# Patient Record
Sex: Male | Born: 1965 | Race: Black or African American | Hispanic: No | Marital: Single | Smoking: Current every day smoker
Health system: Southern US, Community
[De-identification: ages and names within clinical notes are randomized; demographics above are authoritative.]

## PROBLEM LIST (undated history)

## (undated) DIAGNOSIS — I10 Essential (primary) hypertension: Secondary | ICD-10-CM

## (undated) DIAGNOSIS — W3400XA Accidental discharge from unspecified firearms or gun, initial encounter: Secondary | ICD-10-CM

## (undated) DIAGNOSIS — D649 Anemia, unspecified: Secondary | ICD-10-CM

## (undated) HISTORY — PX: SHOULDER SURGERY: SHX246

---

## 2019-04-27 ENCOUNTER — Emergency Department (HOSPITAL_COMMUNITY)
Admission: EM | Admit: 2019-04-27 | Discharge: 2019-04-27 | Disposition: A | Discharge status: Home / Self Care | Payer: Self-pay | Attending: Emergency Medicine | Admitting: Emergency Medicine

## 2019-04-27 ENCOUNTER — Other Ambulatory Visit: Payer: Self-pay

## 2019-04-27 DIAGNOSIS — Y9289 Other specified places as the place of occurrence of the external cause: Secondary | ICD-10-CM | POA: Insufficient documentation

## 2019-04-27 DIAGNOSIS — S39012A Strain of muscle, fascia and tendon of lower back, initial encounter: Secondary | ICD-10-CM | POA: Insufficient documentation

## 2019-04-27 DIAGNOSIS — Y9389 Activity, other specified: Secondary | ICD-10-CM | POA: Insufficient documentation

## 2019-04-27 DIAGNOSIS — X500XXA Overexertion from strenuous movement or load, initial encounter: Secondary | ICD-10-CM | POA: Insufficient documentation

## 2019-04-27 DIAGNOSIS — Y999 Unspecified external cause status: Secondary | ICD-10-CM | POA: Insufficient documentation

## 2019-04-27 MED ORDER — CYCLOBENZAPRINE HCL 10 MG PO TABS: 10.0000 mg | ORAL_TABLET | Freq: Two times a day (BID) | ORAL | 0 refills | Status: AC | PRN

## 2019-04-27 MED ORDER — IBUPROFEN 600 MG PO TABS: 600.0000 mg | ORAL_TABLET | Freq: Four times a day (QID) | ORAL | 0 refills | Status: AC | PRN

## 2019-04-27 MED ORDER — IBUPROFEN 800 MG PO TABS
800.0000 mg | ORAL_TABLET | Freq: Once | ORAL | Status: AC
  Administered 2019-04-27: 800 mg via ORAL
  Filled 2019-04-27: qty 1

## 2019-04-27 MED ORDER — CYCLOBENZAPRINE HCL 10 MG PO TABS
5.0000 mg | ORAL_TABLET | Freq: Once | ORAL | Status: AC
  Administered 2019-04-27: 5 mg via ORAL
  Filled 2019-04-27: qty 1

## 2019-04-27 NOTE — ED Triage Notes (Signed)
Per pt he said he was at home at got up from chair and felt like a burning sensation in his right lower back. No injury no fall

## 2019-04-27 NOTE — ED Provider Notes (Signed)
[facility] EMERGENCY DEPARTMENT Provider Note   CSN: [id number] Arrival date & time: 04/27/19  2023     History   Chief Complaint Chief Complaint  Patient presents with  . Back Pain    HPI James Ransom. is a 53 y.o. male.     The history is provided by the patient. No language interpreter was used.  Back Pain Associated symptoms: no fever and no numbness      53 year old male presenting for evaluation of back pain.  Patient report approximately an hour ago he was sitting at work, he stood up when he felt pain to his right lower back.  He described pain as a burning sensation, nonradiating, worsening with movement. Rest pain is minimal.  He does not complain of any fever or chills no dysuria hematuria abdominal pain bowel bladder incontinence or saddle anesthesia.  He denies any recent heavy lifting or strenuous activities.  No history of kidney stones.  No history of AAA.  No lightheadedness or dizziness.  No chest pain or shortness of breath.  He denies any specific treatment tried.  No past medical history on file.  There are no active problems to display for this patient.   The histories are not reviewed yet. Please review them in the "History" navigator section and refresh this [place].      Home Medications    Prior to Admission medications   Not on File    Family History No family history on file.  Social History Social History   Tobacco Use  . Smoking status: Not on file  Substance Use Topics  . Alcohol use: Not on file  . Drug use: Not on file     Allergies   Penicillins   Review of Systems Review of Systems  Constitutional: Negative for fever.  Musculoskeletal: Positive for back pain.  Neurological: Negative for numbness.     Physical Exam Updated Vital Signs BP (!) 167/102 (BP Location: Left Arm)   Pulse 88   Temp 98.6 F (37 C) (Oral)   Resp 18   Ht 5\' 10"  (1.778 m)   Wt 113.4 kg   SpO2 95%   BMI 35.87  kg/m   Physical Exam Vitals signs and nursing note reviewed.  Constitutional:      General: He is not in acute distress.    Appearance: He is well-developed.  HENT:     Head: Atraumatic.  Eyes:     Conjunctiva/sclera: Conjunctivae normal.  Neck:     Musculoskeletal: Neck supple.  Abdominal:     Palpations: Abdomen is soft.     Tenderness: There is no abdominal tenderness.  Musculoskeletal:        General: Tenderness (Tenderness to right lumbar paraspinal muscle on palpation.  No significant midline spine tenderness crepitus or step-off.  No overlying skin changes.  Negative straight leg raise.) present.  Skin:    Findings: No rash.  Neurological:     Mental Status: He is alert.      ED Treatments / Results  Labs (all labs ordered are listed, but only abnormal results are displayed) Labs Reviewed - No data to display  EKG    Radiology No results found.  Procedures Procedures (including critical care time)  Medications Ordered in ED Medications  ibuprofen (ADVIL) tablet 800 mg (has no administration in time range)  cyclobenzaprine (FLEXERIL) tablet 5 mg (has no administration in time range)     Initial Impression / Assessment and Plan / ED  Course  I have reviewed the triage vital signs and the nursing notes.  Pertinent labs & imaging results that were available during my care of the patient were reviewed by me and considered in my medical decision making (see chart for details).        BP (!) 167/102 (BP Location: Left Arm)   Pulse 88   Temp 98.6 F (37 C) (Oral)   Resp 18   Ht 5\' 10"  (1.778 m)   Wt 113.4 kg   SpO2 95%   BMI 35.87 kg/m    Final Clinical Impressions(s) / ED Diagnoses   Final diagnoses:  Strain of lumbar region, initial encounter    ED Discharge Orders         Ordered    ibuprofen (ADVIL) 600 MG tablet  Every 6 hours PRN     04/27/19 2124    cyclobenzaprine (FLEXERIL) 10 MG tablet  2 times daily PRN     04/27/19 2124          9:23 PM Patient here with right lower back pain reproducible on exam.  This is likely muscle skeletal strain.  Low suspicion for kidney stones, aortic dissection, herpes zoster, or other acute emergent medical condition.  No red flags.  He is able to ambulate.  Patient discharged home with rice therapy and return precaution.   Fayrene Helperran, Liliana Brentlinger, PA-C 04/27/19 2125    Little, Ambrose Finlandachel Morgan, MD 04/27/19 2129

## 2020-08-23 ENCOUNTER — Encounter (HOSPITAL_COMMUNITY): Payer: Self-pay | Admitting: Emergency Medicine

## 2020-08-23 ENCOUNTER — Other Ambulatory Visit: Payer: Self-pay

## 2020-08-23 ENCOUNTER — Ambulatory Visit (HOSPITAL_COMMUNITY)
Admission: EM | Admit: 2020-08-23 | Discharge: 2020-08-23 | Disposition: A | Discharge status: Home / Self Care | Payer: Self-pay | Attending: Family Medicine | Admitting: Family Medicine

## 2020-08-23 DIAGNOSIS — L739 Follicular disorder, unspecified: Secondary | ICD-10-CM

## 2020-08-23 MED ORDER — DOXYCYCLINE HYCLATE 100 MG PO CAPS: 100.0000 mg | ORAL_CAPSULE | Freq: Two times a day (BID) | ORAL | 0 refills | Status: DC

## 2020-08-23 NOTE — ED Triage Notes (Signed)
PT had two itching areas on scalp that are now individual abscesses.

## 2020-08-23 NOTE — Discharge Instructions (Addendum)
I have sent in doxycycline for you to take twice a day for 7 days  This should decrease the swelling and inflammation surrounding the areas on her head.  Take a hot shower, and then you may apply warm compresses to the areas so that if they will drain they can do it on their own since there is already an opening to the area.  If this does not resolve her symptoms, follow-up with this office or with primary care as needed  Follow-up with the ER for high fever, trouble swallowing, trouble breathing, other concerning symptoms

## 2020-08-23 NOTE — ED Provider Notes (Signed)
[facility]   [id number] 08/23/20 Arrival Time: 1337  CC: RASH  SUBJECTIVE:  James Harding. is a 54 y.o. male who presents with a skin complaint that began about a week ago.  Reports that he thought that he had two bug bites on his scalp, as they were itchy over the last week.  Reports that the areas are now swollen and he thinks they have fluid under them.  Has not attempted OTC treatment for this. Denies precipitating event or trauma.  Denies changes in soaps, detergents, close contacts with similar rash, known trigger or environmental trigger, allergy. Denies medications change or starting a new medication recently. Localizes the rash to the scalp. There are no aggravating or alleviating factors. Denies similar symptoms in the past. Denies fever, chills, nausea, vomiting, erythema, discharge, oral lesions, SOB, chest pain, abdominal pain, changes in bowel or bladder function.    ROS: As per HPI.  All other pertinent ROS negative.     History reviewed. No pertinent past medical history. History reviewed. No pertinent surgical history. Allergies  Allergen Reactions  . Penicillins    No current facility-administered medications on file prior to encounter.   Current Outpatient Medications on File Prior to Encounter  Medication Sig Dispense Refill  . cyclobenzaprine (FLEXERIL) 10 MG tablet Take 1 tablet (10 mg total) by mouth 2 (two) times daily as needed for muscle spasms. 20 tablet 0  . ibuprofen (ADVIL) 600 MG tablet Take 1 tablet (600 mg total) by mouth every 6 (six) hours as needed. 30 tablet 0   Social History   Socioeconomic History  . Marital status: Significant Other    Spouse name: Not on file  . Number of children: Not on file  . Years of education: Not on file  . Highest education level: Not on file  Occupational History  . Not on file  Tobacco Use  . Smoking status: Current Every Day Smoker    Packs/day: 0.50    Types: Cigarettes  . Smokeless  tobacco: Never Used  Substance and Sexual Activity  . Alcohol use: Not on file  . Drug use: Not on file  . Sexual activity: Not on file  Other Topics Concern  . Not on file  Social History Narrative  . Not on file   Social Determinants of Health   Financial Resource Strain:   . Difficulty of Paying Living Expenses: Not on file  Food Insecurity:   . Worried About Programme researcher, broadcasting/film/video in the Last Year: Not on file  . Ran Out of Food in the Last Year: Not on file  Transportation Needs:   . Lack of Transportation (Medical): Not on file  . Lack of Transportation (Non-Medical): Not on file  Physical Activity:   . Days of Exercise per Week: Not on file  . Minutes of Exercise per Session: Not on file  Stress:   . Feeling of Stress : Not on file  Social Connections:   . Frequency of Communication with Friends and Family: Not on file  . Frequency of Social Gatherings with Friends and Family: Not on file  . Attends Religious Services: Not on file  . Active Member of Clubs or Organizations: Not on file  . Attends Banker Meetings: Not on file  . Marital Status: Not on file  Intimate Partner Violence:   . Fear of Current or Ex-Partner: Not on file  . Emotionally Abused: Not on file  . Physically Abused: Not on file  .  Sexually Abused: Not on file   No family history on file.  OBJECTIVE: Vitals:   08/23/20 1434  BP: (!) 159/113  Pulse: 88  Resp: 16  Temp: 99.1 F (37.3 C)  TempSrc: Oral  SpO2: 96%    General appearance: alert; no distress Head: NCAT Lungs: clear to auscultation bilaterally Heart: regular rate and rhythm.  Radial pulse 2+ bilaterally Extremities: no edema Skin: warm and dry; there are two areas present on the scalp about a centimeter each in diameter, that are mildly swollen with a centralized lesion.  The lesion to the back of the scalp appears to have been opened previously. Psychological: alert and cooperative; normal mood and  affect  ASSESSMENT & PLAN:  1. Folliculitis     Meds ordered this encounter  Medications  . doxycycline (VIBRAMYCIN) 100 MG capsule    Sig: Take 1 capsule (100 mg total) by mouth 2 (two) times daily.    Dispense:  14 capsule    Refill:  0    Order Specific Question:   Supervising Provider    Answer:   Merrilee Jansky [[id number]]     We will treat for folliculitis Prescribe doxycycline Take as prescribed and to completion Avoid hot showers/ baths Moisturize skin daily  Follow up with PCP if symptoms persists Return or go to the ER if you have any new or worsening symptoms such as fever, chills, nausea, vomiting, redness, swelling, discharge, if symptoms do not improve with medications  Reviewed expectations re: course of current medical issues. Questions answered. Outlined signs and symptoms indicating need for more acute intervention. Patient verbalized understanding. After Visit Summary given.   Moshe Cipro, NP 08/23/20 1454

## 2021-09-16 ENCOUNTER — Encounter (HOSPITAL_BASED_OUTPATIENT_CLINIC_OR_DEPARTMENT_OTHER): Payer: Self-pay

## 2021-09-16 ENCOUNTER — Other Ambulatory Visit: Payer: Self-pay

## 2021-09-16 ENCOUNTER — Emergency Department (HOSPITAL_BASED_OUTPATIENT_CLINIC_OR_DEPARTMENT_OTHER)
Admission: EM | Admit: 2021-09-16 | Discharge: 2021-09-16 | Disposition: A | Discharge status: Home / Self Care | Payer: Self-pay | Attending: Emergency Medicine | Admitting: Emergency Medicine

## 2021-09-16 ENCOUNTER — Emergency Department (HOSPITAL_BASED_OUTPATIENT_CLINIC_OR_DEPARTMENT_OTHER): Payer: Self-pay

## 2021-09-16 DIAGNOSIS — Y9354 Activity, bowling: Secondary | ICD-10-CM | POA: Insufficient documentation

## 2021-09-16 DIAGNOSIS — W01198A Fall on same level from slipping, tripping and stumbling with subsequent striking against other object, initial encounter: Secondary | ICD-10-CM | POA: Insufficient documentation

## 2021-09-16 DIAGNOSIS — F17210 Nicotine dependence, cigarettes, uncomplicated: Secondary | ICD-10-CM | POA: Insufficient documentation

## 2021-09-16 DIAGNOSIS — M25562 Pain in left knee: Secondary | ICD-10-CM | POA: Insufficient documentation

## 2021-09-16 HISTORY — DX: Accidental discharge from unspecified firearms or gun, initial encounter: W34.00XA

## 2021-09-16 MED ORDER — KETOROLAC TROMETHAMINE 60 MG/2ML IM SOLN
60.0000 mg | Freq: Once | INTRAMUSCULAR | Status: AC
  Administered 2021-09-16: 60 mg via INTRAMUSCULAR
  Filled 2021-09-16: qty 2

## 2021-09-16 NOTE — Discharge Instructions (Signed)
You may take tylenol (acetaminophen) 1000 mg 4 times a day for 1 week. This is the maximum dose of Tylenol you can take from all sources. Please check other over-the-counter medications and prescriptions to ensure you are not taking other medications that contain acetaminophen.  You may also take ibuprofen 400 mg 6 times a day alternating with or at the same time as tylenol.

## 2021-09-16 NOTE — ED Notes (Addendum)
Pt discharged to home. Discharge instructions have been discussed with patient and/or family members. Pt verbally acknowledges understanding d/c instructions, and endorses comprehension to checkout at registration before leaving. Pt demonstrated use of crutches prior to d/c

## 2021-09-16 NOTE — ED Triage Notes (Signed)
Pt arrives to ED in wheelchair states that he was bowling last night and passed the line on the floor hitting the slick lane causing him to fall states his left leg went under him. Reports he was able to get up when it happened, did not hit head, no LOC. Reports pain in knee with standing today.

## 2021-09-16 NOTE — ED Notes (Signed)
ED Provider at bedside. 
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
## 2021-10-30 ENCOUNTER — Emergency Department (HOSPITAL_BASED_OUTPATIENT_CLINIC_OR_DEPARTMENT_OTHER): Payer: Self-pay

## 2021-10-30 ENCOUNTER — Emergency Department (HOSPITAL_BASED_OUTPATIENT_CLINIC_OR_DEPARTMENT_OTHER)
Admission: EM | Admit: 2021-10-30 | Discharge: 2021-10-30 | Disposition: A | Discharge status: Home / Self Care | Payer: Self-pay | Attending: Emergency Medicine | Admitting: Emergency Medicine

## 2021-10-30 ENCOUNTER — Other Ambulatory Visit: Payer: Self-pay

## 2021-10-30 ENCOUNTER — Encounter (HOSPITAL_BASED_OUTPATIENT_CLINIC_OR_DEPARTMENT_OTHER): Payer: Self-pay

## 2021-10-30 DIAGNOSIS — F17290 Nicotine dependence, other tobacco product, uncomplicated: Secondary | ICD-10-CM | POA: Insufficient documentation

## 2021-10-30 DIAGNOSIS — L089 Local infection of the skin and subcutaneous tissue, unspecified: Secondary | ICD-10-CM | POA: Insufficient documentation

## 2021-10-30 DIAGNOSIS — M79671 Pain in right foot: Secondary | ICD-10-CM | POA: Insufficient documentation

## 2021-10-30 LAB — CBG MONITORING, ED: Glucose-Capillary: 100 mg/dL — ABNORMAL HIGH (ref 70–99)

## 2021-10-30 MED ORDER — DOXYCYCLINE HYCLATE 100 MG PO CAPS: 100.0000 mg | ORAL_CAPSULE | Freq: Two times a day (BID) | ORAL | 0 refills | Status: DC

## 2021-10-30 NOTE — ED Triage Notes (Signed)
Pt c/o blisters/ swelling to right foot started 12/11-NAD-steady gait

## 2021-10-30 NOTE — Discharge Instructions (Addendum)
Please use Tylenol or ibuprofen for pain.  You may use 600 mg ibuprofen every 6 hours or 1000 mg of Tylenol every 6 hours.  You may choose to alternate between the 2.  This would be most effective.  Not to exceed 4 g of Tylenol within 24 hours.  Not to exceed 3200 mg ibuprofen 24 hours.  Please follow up with a podiatrist at your earliest convenience for further evaluation.
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
## 2021-11-27 ENCOUNTER — Other Ambulatory Visit (HOSPITAL_BASED_OUTPATIENT_CLINIC_OR_DEPARTMENT_OTHER): Payer: Self-pay

## 2021-11-27 ENCOUNTER — Other Ambulatory Visit: Payer: Self-pay

## 2021-11-27 ENCOUNTER — Encounter (HOSPITAL_BASED_OUTPATIENT_CLINIC_OR_DEPARTMENT_OTHER): Payer: Self-pay

## 2021-11-27 ENCOUNTER — Emergency Department (HOSPITAL_BASED_OUTPATIENT_CLINIC_OR_DEPARTMENT_OTHER)
Admission: EM | Admit: 2021-11-27 | Discharge: 2021-11-27 | Disposition: A | Discharge status: Home / Self Care | Payer: BC Managed Care – PPO | Attending: Emergency Medicine | Admitting: Emergency Medicine

## 2021-11-27 DIAGNOSIS — I10 Essential (primary) hypertension: Secondary | ICD-10-CM | POA: Diagnosis present

## 2021-11-27 DIAGNOSIS — Z79899 Other long term (current) drug therapy: Secondary | ICD-10-CM | POA: Diagnosis not present

## 2021-11-27 MED ORDER — AMLODIPINE BESYLATE 5 MG PO TABS
5.0000 mg | ORAL_TABLET | Freq: Every day | ORAL | 2 refills | Status: AC
  Filled 2021-11-27: qty 30, 30d supply, fill #0
  Filled 2022-01-06: qty 30, 30d supply, fill #1

## 2021-11-27 NOTE — ED Triage Notes (Addendum)
Pt c/o elevated BP today at the dentist "180/120"-advised to come to ED-denies hx dx HTN/no meds/no PCP-denies pain-states "little headache yesterday"-NAD-steady gait

## 2021-11-27 NOTE — ED Provider Notes (Signed)
[facility] EMERGENCY DEPARTMENT Provider Note   CSN: [id number] Arrival date & time: 11/27/21  1259     History  Chief Complaint  Patient presents with   Hypertension    James Penkala Weslee Fogg. is a 56 y.o. male.  Sent here from dentist office for evaluation of high blood pressure.  Asymptomatic.  No chest pain or shortness of breath.  No stroke symptoms.  States that he has had high blood pressure readings in the past but is not on blood pressure medications.  Does not have a primary care doctor.  The history is provided by the patient.  Hypertension This is a new problem. The current episode started more than 1 week ago. The problem occurs every several days. Pertinent negatives include no chest pain, no abdominal pain, no headaches and no shortness of breath. Nothing aggravates the symptoms. Nothing relieves the symptoms. He has tried nothing for the symptoms. The treatment provided no relief.      Home Medications Prior to Admission medications   Medication Sig Start Date End Date Taking? Authorizing Provider  amLODipine (NORVASC) 5 MG tablet Take 1 tablet (5 mg total) by mouth daily. 11/27/21 12/27/21 Yes Arvie Bartholomew, DO  cyclobenzaprine (FLEXERIL) 10 MG tablet Take 1 tablet (10 mg total) by mouth 2 (two) times daily as needed for muscle spasms. 04/27/19   Fayrene Helper, PA-C  doxycycline (VIBRAMYCIN) 100 MG capsule Take 1 capsule (100 mg total) by mouth 2 (two) times daily. 10/30/21   Prosperi, Christian H, PA-C  ibuprofen (ADVIL) 600 MG tablet Take 1 tablet (600 mg total) by mouth every 6 (six) hours as needed. 04/27/19   Fayrene Helper, PA-C      Allergies    Penicillins    Review of Systems   Review of Systems  Respiratory:  Negative for shortness of breath.   Cardiovascular:  Negative for chest pain.  Gastrointestinal:  Negative for abdominal pain.  Neurological:  Negative for headaches.   Physical Exam Updated Vital Signs BP (!) 162/104 (BP Location: Left  Arm)    Pulse 99    Temp 98.3 F (36.8 C) (Oral)    Resp 18    Ht 5\' 10"  (1.778 m)    Wt 128.8 kg    SpO2 97%    BMI 40.75 kg/m  Physical Exam Vitals and nursing note reviewed.  Constitutional:      General: He is not in acute distress.    Appearance: He is well-developed.  HENT:     Head: Normocephalic and atraumatic.  Eyes:     Extraocular Movements: Extraocular movements intact.     Conjunctiva/sclera: Conjunctivae normal.     Pupils: Pupils are equal, round, and reactive to light.  Cardiovascular:     Rate and Rhythm: Normal rate and regular rhythm.     Pulses: Normal pulses.     Heart sounds: No murmur heard. Pulmonary:     Effort: Pulmonary effort is normal. No respiratory distress.     Breath sounds: Normal breath sounds.  Abdominal:     Palpations: Abdomen is soft.     Tenderness: There is no abdominal tenderness.  Musculoskeletal:        General: No swelling.     Cervical back: Neck supple.  Skin:    General: Skin is warm and dry.     Capillary Refill: Capillary refill takes less than 2 seconds.  Neurological:     General: No focal deficit present.     Mental Status:  He is alert and oriented to person, place, and time.     Cranial Nerves: No cranial nerve deficit.     Sensory: No sensory deficit.     Motor: No weakness.     Coordination: Coordination normal.     Gait: Gait normal.  Psychiatric:        Mood and Affect: Mood normal.    ED Results / Procedures / Treatments   Labs (all labs ordered are listed, but only abnormal results are displayed) Labs Reviewed - No data to display  EKG None  Radiology No results found.  Procedures Procedures    Medications Ordered in ED Medications - No data to display  ED Course/ Medical Decision Making/ A&P                           Medical Decision Making  James Harding Jonathandavid Marlett. is a 56 year old male who presents to the emergency department with high blood pressure.  Blood pressure 162/104 upon arrival.  He  is asymptomatic.  No chest pain, no stroke symptoms.  He has normal neurological exam and is very well-appearing.  He was sent here from the dentist office after he had a blood pressure there of 180 systolic.  States that he has had high blood pressure readings in the past but has not establish a primary care doctor.  Upon chart review it does appear that he has had blood pressures that have been abnormal in the past and overall we will start him on amlodipine.  He understands the medication side effects and return precautions and was discharged in the ED in good condition.  Given information to follow-up with primary care.  This chart was dictated using voice recognition software.  Despite best efforts to proofread,  errors can occur which can change the documentation meaning.         Final Clinical Impression(s) / ED Diagnoses Final diagnoses:  Hypertension, unspecified type    Rx / DC Orders ED Discharge Orders          Ordered    amLODipine (NORVASC) 5 MG tablet  Daily        11/27/21 1354              Virgina Norfolk, DO 11/27/21 1357

## 2022-01-06 ENCOUNTER — Other Ambulatory Visit (HOSPITAL_BASED_OUTPATIENT_CLINIC_OR_DEPARTMENT_OTHER): Payer: Self-pay

## 2022-02-17 ENCOUNTER — Emergency Department (HOSPITAL_BASED_OUTPATIENT_CLINIC_OR_DEPARTMENT_OTHER)
Admission: EM | Admit: 2022-02-17 | Discharge: 2022-02-17 | Disposition: A | Discharge status: Home / Self Care | Payer: BC Managed Care – PPO | Attending: Emergency Medicine | Admitting: Emergency Medicine

## 2022-02-17 ENCOUNTER — Encounter (HOSPITAL_BASED_OUTPATIENT_CLINIC_OR_DEPARTMENT_OTHER): Payer: Self-pay | Admitting: Emergency Medicine

## 2022-02-17 ENCOUNTER — Other Ambulatory Visit: Payer: Self-pay

## 2022-02-17 DIAGNOSIS — I10 Essential (primary) hypertension: Secondary | ICD-10-CM | POA: Diagnosis not present

## 2022-02-17 DIAGNOSIS — S90821A Blister (nonthermal), right foot, initial encounter: Secondary | ICD-10-CM | POA: Insufficient documentation

## 2022-02-17 DIAGNOSIS — Z79899 Other long term (current) drug therapy: Secondary | ICD-10-CM | POA: Diagnosis not present

## 2022-02-17 DIAGNOSIS — X58XXXA Exposure to other specified factors, initial encounter: Secondary | ICD-10-CM | POA: Insufficient documentation

## 2022-02-17 DIAGNOSIS — S99921A Unspecified injury of right foot, initial encounter: Secondary | ICD-10-CM | POA: Diagnosis present

## 2022-02-17 HISTORY — DX: Essential (primary) hypertension: I10

## 2022-02-17 HISTORY — DX: Anemia, unspecified: D64.9

## 2022-02-17 LAB — CBG MONITORING, ED: Glucose-Capillary: 114 mg/dL — ABNORMAL HIGH (ref 70–99)

## 2022-02-17 MED ORDER — DOXYCYCLINE HYCLATE 100 MG PO CAPS: 100.0000 mg | ORAL_CAPSULE | Freq: Two times a day (BID) | ORAL | 0 refills | Status: DC

## 2022-02-17 NOTE — ED Provider Notes (Signed)
?[facility] EMERGENCY DEPT ?Provider Note ? ? ?CSN: [id number] ?Arrival date & time: 02/17/22  1224 ? ?  ? ?History ? ?Chief Complaint  ?Patient presents with  ? Foot Swelling  ? ? ?Aren Pryde Verna Hamon. is a 56 y.o. male with a past medical history of hypertension presenting today due to a blister and pain to the medial right foot.  Says that the discomfort started last night and this morning he noted a blister.  No trauma.  Says he has not started to wear any new socks or shoes.  Says that this is happened twice in the past and he has been prescribed antibiotics that helped him.  No history of diabetes and is not immunocompromise.  No numbness or tingling and reports full sensation distally. ? ?HPI ? ?  ? ?Home Medications ?Prior to Admission medications   ?Medication Sig Start Date End Date Taking? Authorizing Provider  ?amLODipine (NORVASC) 5 MG tablet Take 1 tablet (5 mg total) by mouth daily. 11/27/21 02/05/22  Virgina Norfolk, DO  ?cyclobenzaprine (FLEXERIL) 10 MG tablet Take 1 tablet (10 mg total) by mouth 2 (two) times daily as needed for muscle spasms. 04/27/19   Fayrene Helper, PA-C  ?doxycycline (VIBRAMYCIN) 100 MG capsule Take 1 capsule (100 mg total) by mouth 2 (two) times daily. 10/30/21   Prosperi, Christian H, PA-C  ?ibuprofen (ADVIL) 600 MG tablet Take 1 tablet (600 mg total) by mouth every 6 (six) hours as needed. 04/27/19   Fayrene Helper, PA-C  ?   ? ?Allergies    ?Penicillins   ? ?Review of Systems   ?Review of Systems ? ?Physical Exam ?Updated Vital Signs ?BP (!) 134/108 (BP Location: Left Arm)   Pulse (!) 107   Temp 98.1 ?F (36.7 ?C) (Oral)   Resp 18   Ht 5\' 10"  (1.778 m)   Wt 128.4 kg   SpO2 97%   BMI 40.61 kg/m?  ?Physical Exam ?Vitals and nursing note reviewed.  ?Constitutional:   ?   Appearance: Normal appearance.  ?HENT:  ?   Head: Normocephalic and atraumatic.  ?Eyes:  ?   General: No scleral icterus. ?   Conjunctiva/sclera: Conjunctivae normal.  ?Cardiovascular:  ?   Pulses:  Normal pulses.  ?Pulmonary:  ?   Effort: Pulmonary effort is normal. No respiratory distress.  ?Musculoskeletal:  ?   Comments: Intact range of motion without pain of the ankle.  Intact strength to dorsiflexion, plantar flexion.   ?Skin: ?   Capillary Refill: Capillary refill takes less than 2 seconds.  ?   Findings: No rash.  ?   Comments: 2 cm blister to the medial right foot, along the arch.  Surrounding erythema.  The blister is intact, not oozing or draining.  All toes with onychomycosis  ?Neurological:  ?   Mental Status: He is alert.  ?Psychiatric:     ?   Mood and Affect: Mood normal.  ? ? ?ED Results / Procedures / Treatments   ?Labs ?(all labs ordered are listed, but only abnormal results are displayed) ?Labs Reviewed  ?CBG MONITORING, ED - Abnormal; Notable for the following components:  ?    Result Value  ? Glucose-Capillary 114 (*)   ? All other components within normal limits  ? ? ?EKG ?None ? ?Radiology ?No results found. ? ?Procedures ?Procedures  ? ?Medications Ordered in ED ?Medications - No data to display ? ?ED Course/ Medical Decision Making/ A&P ?  ?                        ?  Medical Decision Making ? ?56 year old male presenting with right foot pain.  Reports this is happened previously and he is unsure why this recurs but it often resolves with antibiotics.  Overall, his feet are very dry, flaky in all toes with fungal infection.  He has no bony tenderness in the blister is intact. ? ?CBG 114.  Neurovascularly intact.  DP pulse dopplered. ? ?Imaging was considered however patient's symptoms appear to be superficial, doubt osteomyelitis.  I will prescribe antibiotics to cover him for skin infection and encouraged him to follow-up with podiatry.  He says he has never seen them.  Per chart review, he has been referred to podiatry in the past.  We discussed the importance of following up with podiatry as well as his PCP. ? ?Has penicillin allergy, unsure of the reaction.  Will send doxycycline to  the pharmacy. ? ? ? ? ? ? ? ?Final Clinical Impression(s) / ED Diagnoses ?Final diagnoses:  ?Blister (nonthermal), right foot, initial encounter  ? ? ?Rx / DC Orders ?ED Discharge Orders   ? ?      Ordered  ?  doxycycline (VIBRAMYCIN) 100 MG capsule  2 times daily       ? 02/17/22 1329  ? ?  ?  ? ?  ? ?Results and diagnoses were explained to the patient. Return precautions discussed in full. Patient had no additional questions and expressed complete understanding. ? ? ?This chart was dictated using voice recognition software.  Despite best efforts to proofread,  errors can occur which can change the documentation meaning.  ?  ?Saddie Benders, PA-C ?02/17/22 1331 ? ?  ?Jacalyn Lefevre, MD ?02/17/22 1356 ? ?

## 2022-02-17 NOTE — Discharge Instructions (Addendum)
Please make an appointment with the podiatrist attached to these papers.  You may also be evaluated by her primary care provider if your symptoms do not resolve prior to getting in with podiatry. ? ?Be sure to wear shoes with arch support.  It also may help to use gauze or other padding over the blister while you are walking or standing for long periods of time. ?

## 2022-02-17 NOTE — ED Notes (Signed)
RN provided AVS using Teachback Method. Patient verbalizes understanding of Discharge Instructions. Opportunity for Questioning and Answers were provided by RN. Patient Discharged from ED ambulatory to Home. ° °

## 2022-02-17 NOTE — ED Triage Notes (Signed)
Patient c/o right foot swelling, pain and blister starting last night. Reports history of same and unsure the result. Denies any injury. Denies diabetes as hx.  ?

## 2022-07-24 IMAGING — DX DG FOOT COMPLETE 3+V*R*
3 series · 3 of 3 positions shown · non-contrast
Comparison: None

CLINICAL DATA: Foot pain, blisters and swelling to the RIGHT foot.

EXAM:
RIGHT FOOT COMPLETE - 3+ VIEW

[foot ap]
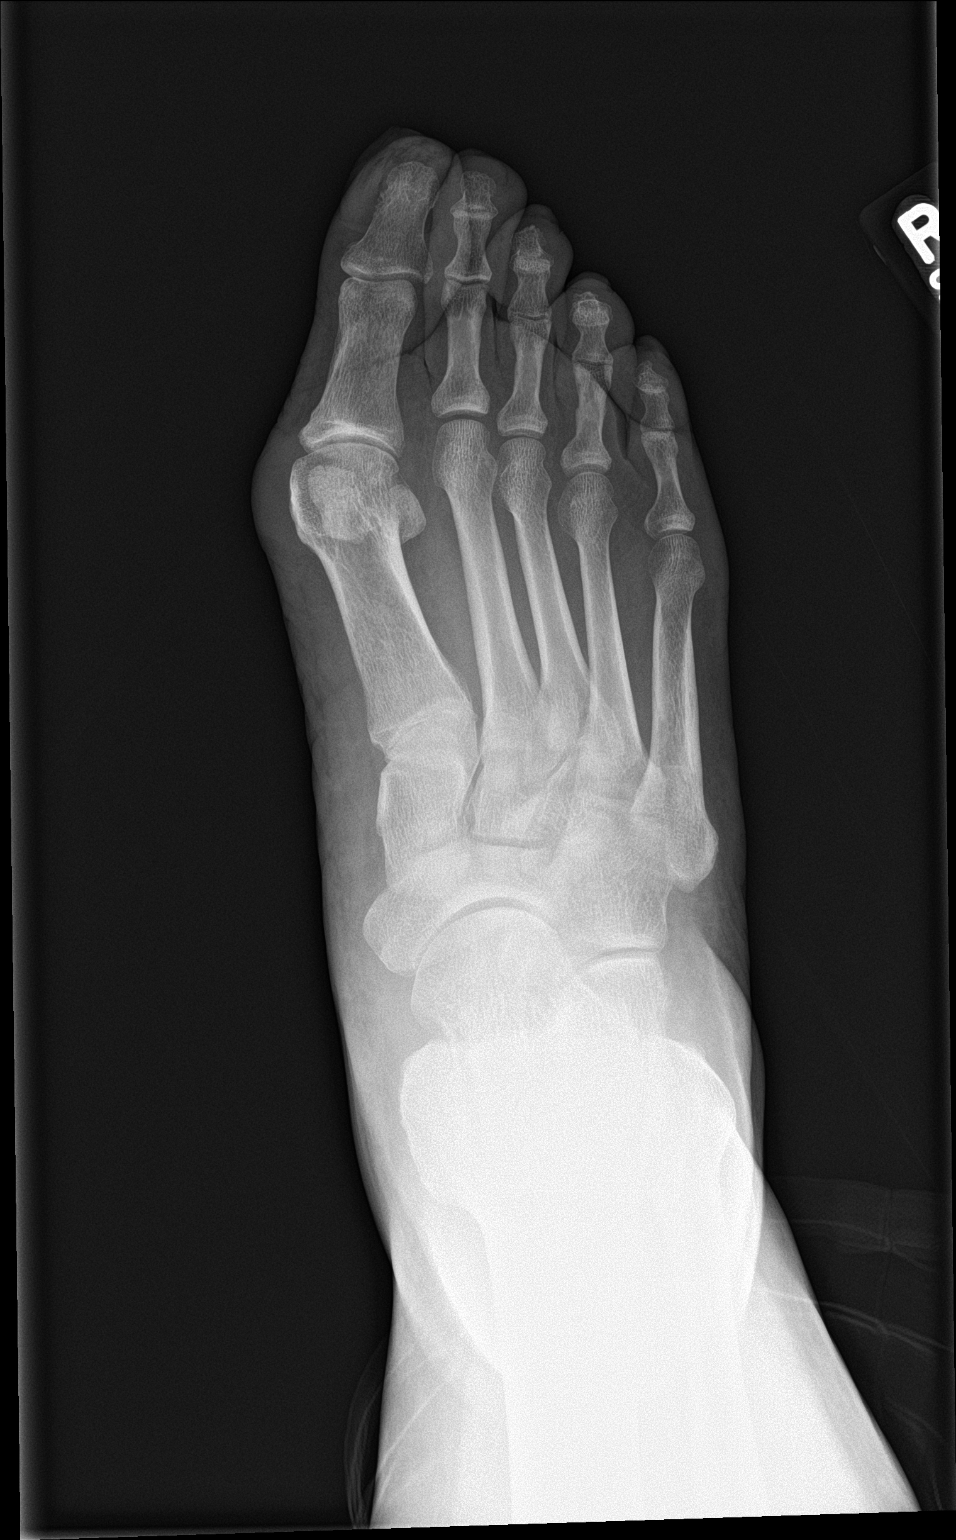

[foot obl]
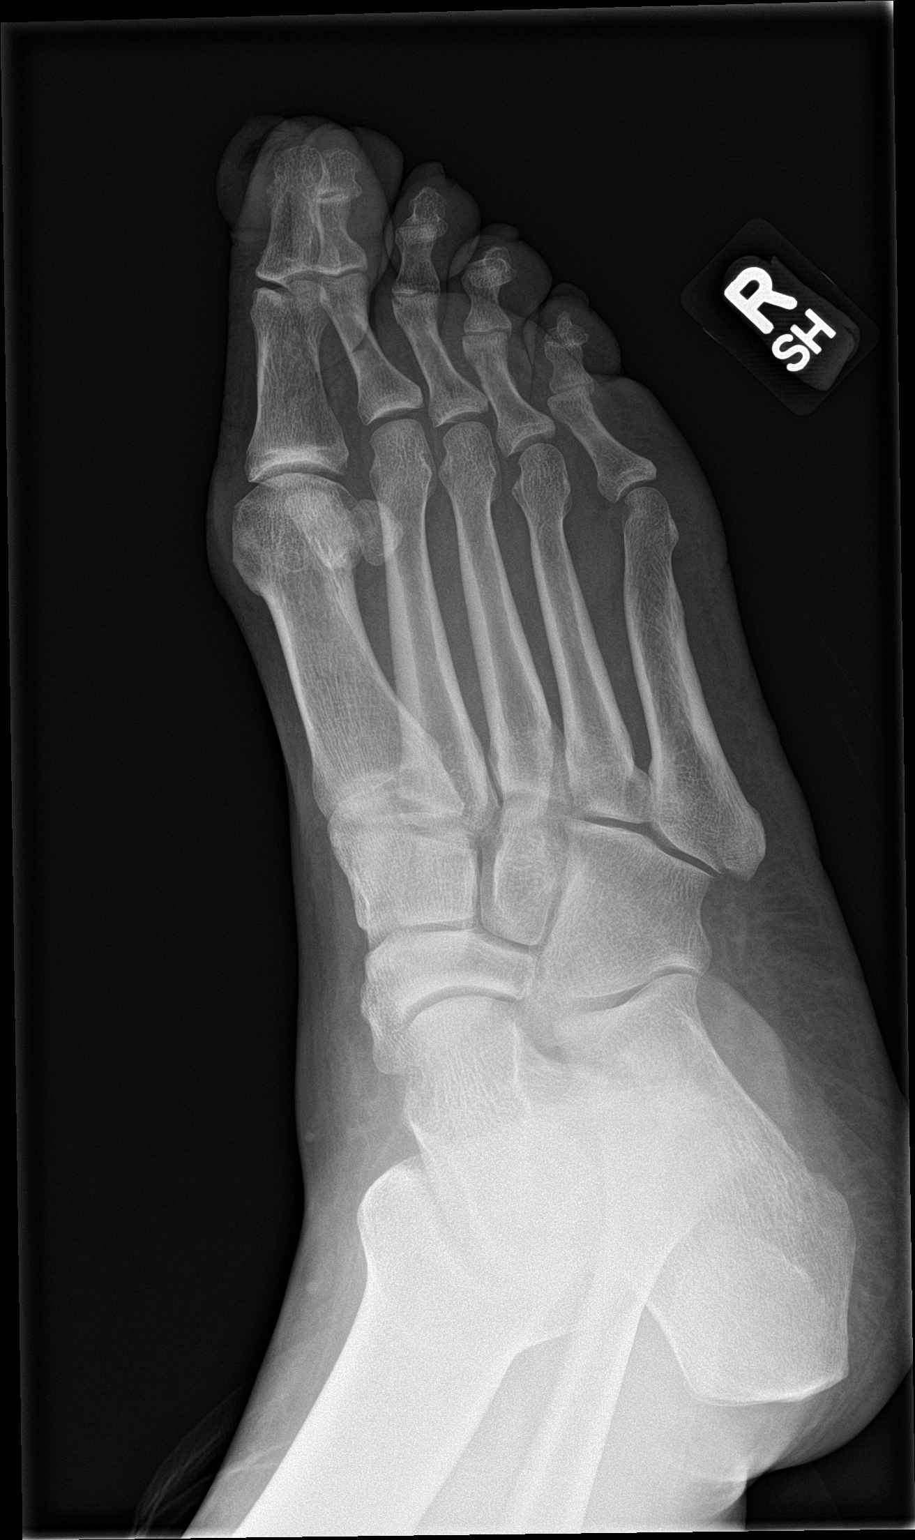

[foot lat]
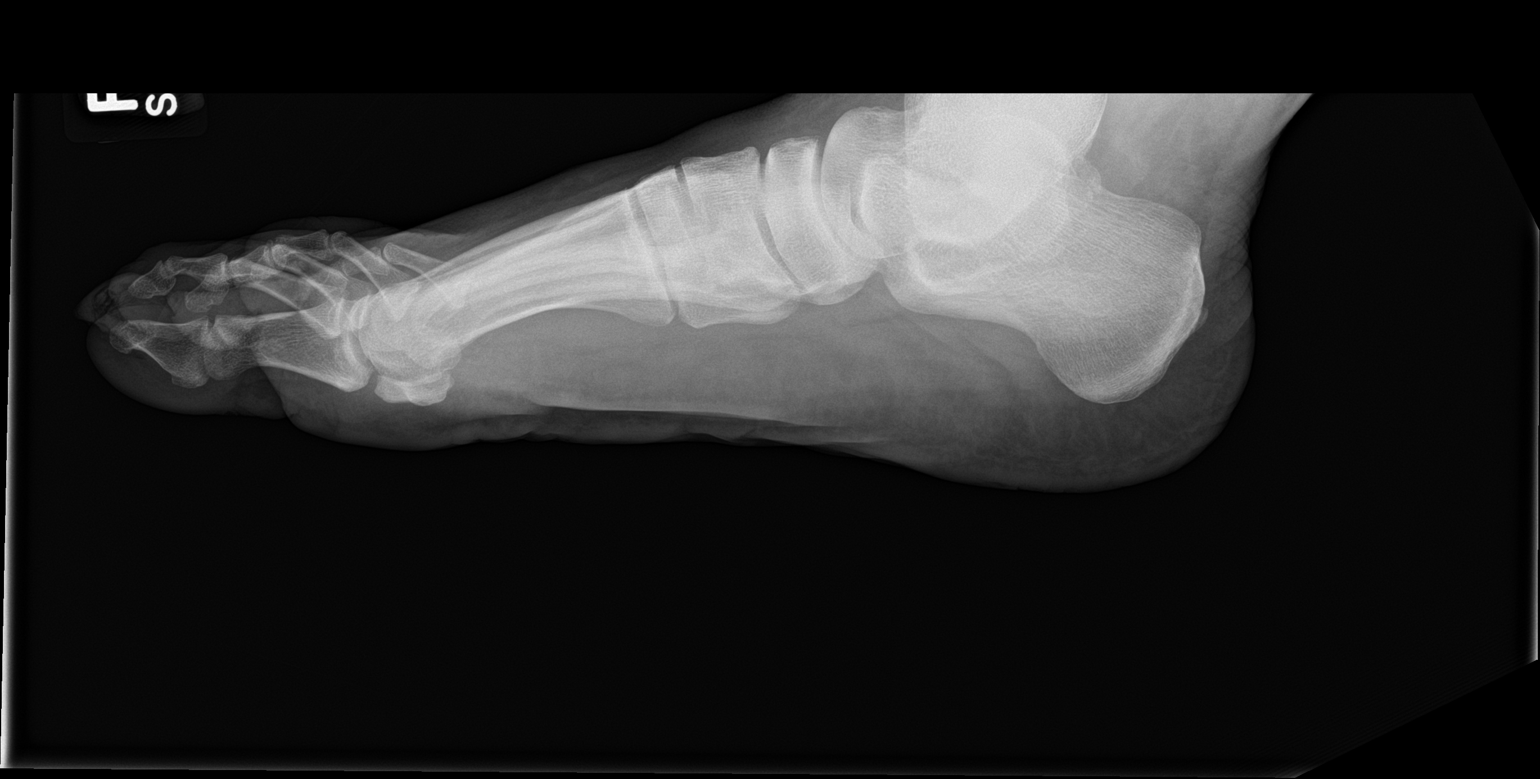

[3 of 3 positions shown; findings below may reference images not displayed]

FINDINGS: Moderate hallux valgus. No signs of acute fracture or acute bony
abnormality. Irregularity of soft tissues about the great toe in the
area of the nail bed. Diffuse mild soft tissue swelling.
IMPRESSION: No acute fracture or dislocation.

Irregularity of soft tissues about the great toe in the area of the
nail bed. Perhaps related to primary abnormality of the nail such as
fungal infection. Correlate with any signs of ulceration or other
abnormality in this location.

Diffuse mild soft tissue swelling.

## 2023-10-08 ENCOUNTER — Other Ambulatory Visit: Payer: Self-pay

## 2023-10-08 ENCOUNTER — Emergency Department (HOSPITAL_BASED_OUTPATIENT_CLINIC_OR_DEPARTMENT_OTHER)
Admission: EM | Admit: 2023-10-08 | Discharge: 2023-10-08 | Disposition: A | Discharge status: Home / Self Care | Payer: BC Managed Care – PPO | Attending: Emergency Medicine | Admitting: Emergency Medicine

## 2023-10-08 ENCOUNTER — Encounter (HOSPITAL_BASED_OUTPATIENT_CLINIC_OR_DEPARTMENT_OTHER): Payer: Self-pay | Admitting: Family Medicine

## 2023-10-08 DIAGNOSIS — R000 Tachycardia, unspecified: Secondary | ICD-10-CM | POA: Diagnosis not present

## 2023-10-08 DIAGNOSIS — Z872 Personal history of diseases of the skin and subcutaneous tissue: Secondary | ICD-10-CM | POA: Insufficient documentation

## 2023-10-08 DIAGNOSIS — L02811 Cutaneous abscess of head [any part, except face]: Secondary | ICD-10-CM | POA: Insufficient documentation

## 2023-10-08 MED ORDER — LIDOCAINE-EPINEPHRINE (PF) 2 %-1:200000 IJ SOLN
INTRAMUSCULAR | Status: AC
  Filled 2023-10-08: qty 20

## 2023-10-08 MED ORDER — DOXYCYCLINE HYCLATE 100 MG PO CAPS: 100.0000 mg | ORAL_CAPSULE | Freq: Two times a day (BID) | ORAL | 0 refills | Status: AC

## 2023-10-08 NOTE — Discharge Instructions (Addendum)
You most likely have a skin abscess of the scalp.   I believe that this will resolve with antibiotics especially after being laced.  We are going to give you a week of an antibiotic called doxycycline twice a day.  Other things you can use to help with the pain and help resolve the abscess are warm compresses, ibuprofen for pain.  Avoid using alcohol or hydrogen peroxide as this can actually cause damage to the skin and dry out the skin causing delayed wound healing.  Use on fragranced or sensitive skin soaps and shampoos.  Avoid shaving the scalp and opt for trimming instead.  If you start to have worsening fever, pain, worsening swelling please seek medical attention either at your doctor's office or return to the emergency department for evaluation. Please replace the gauze if it is soaked or every day.   I recommend you speak with your primary care provider about a referral to a dermatologist.  Additionally I recommend you start medication for prediabetes such as metformin.  Also recommend that you take medication for your blood pressure as it was high today as well.  Treating these conditions will reduce your risk of poor wound healing and abscesses in the future.

## 2023-10-08 NOTE — ED Notes (Signed)
Dr Dalene Seltzer decided to attempt to drain abscess. Discharge pending.

## 2023-10-08 NOTE — ED Triage Notes (Signed)
In for eval of abscess to posterior head/neck onset 2 days ago.

## 2023-10-08 NOTE — ED Provider Notes (Signed)
[place] EMERGENCY DEPARTMENT AT [facility] Provider Note   CSN: [id number] Arrival date & time: 10/08/23  4098     History HTN, Prediabetes  Chief Complaint  Patient presents with   Abscess    James Harding. is a 57 y.o. male.  Patient reports he noticed a small bump on his posterior scalp 2 days ago.  He did not think much of it.  It was slightly painful but yesterday night he felt that the bump had grown to the size of a golf ball.  It was very painful when he would turn in his sleep.  Felt warm but he did not have fever when he checked with a thermometer.  Denies drainage from the mass.  He has not taken any medication for this.  Patient reports he has had abscesses in his head and neck region previously.  He was treated for an abscess on the other side of his scalp/posterior neck in March.  He also reports he has had some smaller old ones that have healed over time.  Did not shave or trim his hair or beard recently.  He denies any nicks or cuts.  Does not work in an area where he could be getting abrasions.  Does not use any substances.  Patient also endorses boils in his axillas and groin area.  Mentions that they would grow and become painful and then pop and drain.  He has had these for many years.  Denies any family history of skin conditions. He has never seen a dermatologist.  Has not started any medication for prediabetes. He wanted to try diet and exercise first.  He also is not taking amlodipine. He said he stopped taking it and started garlic instead.   The history is provided by the patient.  Abscess Location:  Head/neck Head/neck abscess location:  Scalp      Home Medications Prior to Admission medications   Medication Sig Start Date End Date Taking? Authorizing Provider  doxycycline (VIBRAMYCIN) 100 MG capsule Take 1 capsule (100 mg total) by mouth 2 (two) times daily. 10/08/23  Yes Lockie Mola, MD  GARLIC 1500 PO Take 3,000 mg by mouth daily.    Yes [provider]  amLODipine (NORVASC) 5 MG tablet Take 1 tablet (5 mg total) by mouth daily. 11/27/21 02/05/22  Curatolo, Adam, DO  cyclobenzaprine (FLEXERIL) 10 MG tablet Take 1 tablet (10 mg total) by mouth 2 (two) times daily as needed for muscle spasms. 04/27/19   Fayrene Helper, PA-C  ibuprofen (ADVIL) 600 MG tablet Take 1 tablet (600 mg total) by mouth every 6 (six) hours as needed. 04/27/19   Fayrene Helper, PA-C      Allergies    Penicillins    Review of Systems   Review of Systems  Constitutional:  Negative for chills.  Skin:        Bump on posterior scalp/neck with pain and swelling    Physical Exam Updated Vital Signs BP (!) 147/96 (BP Location: Right Arm)   Pulse (!) 109   Temp 98.6 F (37 C) (Oral)   Resp 15   Ht 5\' 10"  (1.778 m)   Wt 126.1 kg   SpO2 97%   BMI 39.89 kg/m  Physical Exam Vitals reviewed.  Constitutional:      General: He is not in acute distress.    Appearance: He is obese.  HENT:     Right Ear: External ear normal.     Left Ear: External ear  normal.     Nose: Nose normal.     Mouth/Throat:     Mouth: Mucous membranes are moist.  Eyes:     Extraocular Movements: Extraocular movements intact.  Cardiovascular:     Rate and Rhythm: Regular rhythm. Tachycardia present.     Pulses: Normal pulses.  Pulmonary:     Effort: Pulmonary effort is normal.  Musculoskeletal:        General: Normal range of motion.     Cervical back: Normal range of motion and neck supple. No tenderness.     Comments: No midline cervical tenderness or tenderness in the mastoid region   Skin:    Findings: Lesion (approximately 3 in indurated lesion on right posterior scalp, with overlying warmth and tenderness. No fluctuance, very mild erythema, central pore visible, without drainage.) present.  Neurological:     Mental Status: He is alert.     ED Results / Procedures / Treatments   Labs (all labs ordered are listed, but only abnormal results are  displayed) Labs Reviewed - No data to display  EKG None  Radiology No results found.  Procedures Procedures    Medications Ordered in ED Medications - No data to display  ED Course/ Medical Decision Making/ A&P Clinical Course as of 10/08/23 0823  [facility] Oct 08, 2023  1610 Pulse Rate(!): 118 Tachycardia but afebrile. Less concerning for sepsis. BP hypertensive  [AB]  0822 Pulse Rate(!): 109 Tachycardia most likely due to patient being nervous to be in the emergency department as he reports [AB]  0823 Continues to be afebrile and hemodynamically stable. [AB]    Clinical Course User Index [AB] Lockie Mola, MD                                 Medical Decision Making This patient presents to the ED with chief complaint(s) of scalp lesion with pertinent past medical history of multiple head and neck abscesses which further complicates the presenting complaint. The complaint involves an extensive differential diagnosis and also carries with it a high risk of complications and morbidity.    The differential diagnosis includes skin abscess, hematoma, subgaleal abscess. Patient does not have systemic signs of infection.  He did not have any previous trauma so unlikely hematoma.  Patient is tender but not in significant pain unlikely subgaleal abscess.  He does have history consistent with hidradenitis suppurativa.  Would benefit from a dermatology evaluation after resolution of this abscess.  Prediabetes and tobacco or other risk factors leading to frequent abscess.  Additional history obtained: Records reviewed previous admission documents, primary care documents   ED Course and Reassessment:   Consideration for admission or further workup: Due to the area still being very indurated without drainage opted to not lance the lesion and continue with antibiotics. Patient was tachycardic but without other signs of systemic infection. He was also nervous about being in the ED causing  tachycardia.  Social Determinants of health: low SES, part of a marginalized demographic     Risk Prescription drug management.    Final Clinical Impression(s) / ED Diagnoses Final diagnoses:  Scalp abscess  H/O hidradenitis suppurativa    Rx / DC Orders ED Discharge Orders          Ordered    doxycycline (VIBRAMYCIN) 100 MG capsule  2 times daily        10/08/23 9604  Lockie Mola, MD 10/08/23 7253    Alvira Monday, MD 10/08/23 2204

## 2023-10-08 NOTE — ED Provider Notes (Signed)
  Physical Exam  BP (!) 147/96 (BP Location: Right Arm)   Pulse (!) 109   Temp 98.6 F (37 C) (Oral)   Resp 15   Ht 5\' 10"  (1.778 m)   Wt 126.1 kg   SpO2 97%   BMI 39.89 kg/m   Physical Exam  Procedures  .Marland KitchenIncision and Drainage  Date/Time: 10/08/2023 9:57 PM  Performed by: Alvira Monday, MD Authorized by: Alvira Monday, MD   Consent:    Consent obtained:  Verbal   Consent given by:  Patient   Risks, benefits, and alternatives were discussed: yes     Risks discussed:  Bleeding, damage to other organs, incomplete drainage, pain and infection   Alternatives discussed:  No treatment Universal protocol:    Patient identity confirmed:  Verbally with patient Location:    Type:  Abscess   Size:  2   Location:  Head   Head location:  Scalp Pre-procedure details:    Skin preparation:  Chlorhexidine Procedure type:    Complexity:  Simple Procedure details:    Incision types:  Stab incision   Wound management:  Probed and deloculated   Drainage:  Purulent and bloody   Drainage amount:  Moderate   Wound treatment:  Wound left open   Packing materials:  1/4 in iodoform gauze Post-procedure details:    Procedure completion:  Tolerated well, no immediate complications   ED Course / MDM   Clinical Course as of 10/08/23 2156  [facility] Oct 08, 2023  4098 Pulse Rate(!): 118 Tachycardia but afebrile. Less concerning for sepsis. BP hypertensive  [AB]  0822 Pulse Rate(!): 109 Tachycardia most likely due to patient being nervous to be in the emergency department as he reports [AB]  0823 Continues to be afebrile and hemodynamically stable. [AB]    Clinical Course User Index [AB] Lockie Mola, MD   See above. Discussed risks and benefits. 2-3cm area of induration.  Initial used needle for aspiration, numbning, then made small stab incision, purulent and bloody drainage.  Low clinical suspicion for subgaleal abscess at this time.  Afebrile, well appearing. Feel he is appropriate  for outpatient antibiotics and strict return precautions        Alvira Monday, MD 10/08/23 2204

## 2023-10-08 NOTE — ED Provider Notes (Incomplete)
  [place] EMERGENCY DEPARTMENT AT [facility] Provider Note   CSN: [id number] Arrival date & time: 10/08/23  4098     History HTN (amlodipine 10 mg and telmisartan 40 mg)  Tobacco use  T2DM  No chief complaint on file.   James Harding. is a 57 y.o. male.  HPI     Home Medications Prior to Admission medications   Medication Sig Start Date End Date Taking? Authorizing Provider  amLODipine (NORVASC) 5 MG tablet Take 1 tablet (5 mg total) by mouth daily. 11/27/21 02/05/22  Curatolo, Adam, DO  cyclobenzaprine (FLEXERIL) 10 MG tablet Take 1 tablet (10 mg total) by mouth 2 (two) times daily as needed for muscle spasms. 04/27/19   Fayrene Helper, PA-C  doxycycline (VIBRAMYCIN) 100 MG capsule Take 1 capsule (100 mg total) by mouth 2 (two) times daily. 02/17/22   Redwine, Madison A, PA-C  ibuprofen (ADVIL) 600 MG tablet Take 1 tablet (600 mg total) by mouth every 6 (six) hours as needed. 04/27/19   Fayrene Helper, PA-C      Allergies    Penicillins    Review of Systems   Review of Systems  Physical Exam Updated Vital Signs There were no vitals taken for this visit. Physical Exam  ED Results / Procedures / Treatments   Labs (all labs ordered are listed, but only abnormal results are displayed) Labs Reviewed - No data to display  EKG None  Radiology No results found.  Procedures Procedures  {Document cardiac monitor, telemetry assessment procedure when appropriate:1}  Medications Ordered in ED Medications - No data to display  ED Course/ Medical Decision Making/ A&P Clinical Course as of 10/08/23 0907  [facility] Oct 08, 2023  1191 Pulse Rate(!): 118 Tachycardia but afebrile. Less concerning for sepsis. BP hypertensive  [AB]  0822 Pulse Rate(!): 109 Tachycardia most likely due to patient being nervous to be in the emergency department as he reports [AB]  0823 Continues to be afebrile and hemodynamically stable. [AB]    Clinical Course User Index [AB] Lockie Mola, MD   {   Click here for ABCD2, HEART and other calculatorsREFRESH Note before signing :1}                              Medical Decision Making Risk Prescription drug management.   ***  {Document critical care time when appropriate:1} {Document review of labs and clinical decision tools ie heart score, Chads2Vasc2 etc:1}  {Document your independent review of radiology images, and any outside records:1} {Document your discussion with family members, caretakers, and with consultants:1} {Document social determinants of health affecting pt's care:1} {Document your decision making why or why not admission, treatments were needed:1} Final Clinical Impression(s) / ED Diagnoses Final diagnoses:  None    Rx / DC Orders ED Discharge Orders     None
